# Patient Record
Sex: Female | Born: 2000 | Race: Black or African American | Hispanic: No | Marital: Single | Smoking: Never smoker
Health system: Southern US, Community
[De-identification: ages and names within clinical notes are randomized; demographics above are authoritative.]

## PROBLEM LIST (undated history)

## (undated) DIAGNOSIS — F909 Attention-deficit hyperactivity disorder, unspecified type: Secondary | ICD-10-CM

## (undated) DIAGNOSIS — R569 Unspecified convulsions: Secondary | ICD-10-CM

---
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
## 2023-05-26 ENCOUNTER — Other Ambulatory Visit: Payer: Medicaid Other

## 2023-05-28 ENCOUNTER — Other Ambulatory Visit: Payer: Medicaid Other

## 2023-05-29 ENCOUNTER — Ambulatory Visit (LOCAL_COMMUNITY_HEALTH_CENTER): Payer: Self-pay

## 2023-05-29 ENCOUNTER — Other Ambulatory Visit: Payer: Self-pay

## 2023-05-29 DIAGNOSIS — Z111 Encounter for screening for respiratory tuberculosis: Secondary | ICD-10-CM

## 2023-06-01 ENCOUNTER — Ambulatory Visit: Payer: Medicaid Other

## 2023-06-01 DIAGNOSIS — Z111 Encounter for screening for respiratory tuberculosis: Secondary | ICD-10-CM

## 2023-06-01 LAB — TB SKIN TEST
Induration: 0 mm
TB Skin Test: NEGATIVE

## 2023-06-15 ENCOUNTER — Encounter: Payer: Self-pay | Admitting: Emergency Medicine

## 2023-06-15 ENCOUNTER — Emergency Department
Admission: EM | Admit: 2023-06-15 | Discharge: 2023-06-15 | Disposition: A | Discharge status: Home / Self Care | Payer: Medicaid Other | Attending: Emergency Medicine | Admitting: Emergency Medicine

## 2023-06-15 ENCOUNTER — Emergency Department: Payer: Medicaid Other

## 2023-06-15 ENCOUNTER — Other Ambulatory Visit: Payer: Self-pay

## 2023-06-15 DIAGNOSIS — R569 Unspecified convulsions: Secondary | ICD-10-CM | POA: Insufficient documentation

## 2023-06-15 DIAGNOSIS — R197 Diarrhea, unspecified: Secondary | ICD-10-CM | POA: Diagnosis not present

## 2023-06-15 LAB — LIPASE, BLOOD: Lipase: 24 U/L (ref 11–51)

## 2023-06-15 LAB — BASIC METABOLIC PANEL
Anion gap: 10 (ref 5–15)
BUN: 7 mg/dL (ref 6–20)
CO2: 22 mmol/L (ref 22–32)
Calcium: 9 mg/dL (ref 8.9–10.3)
Chloride: 104 mmol/L (ref 98–111)
Creatinine, Ser: 0.79 mg/dL (ref 0.44–1.00)
GFR, Estimated: >60 mL/min (ref >60)
Glucose, Bld: 104 mg/dL — ABNORMAL HIGH (ref 70–99)
Potassium: 3.8 mmol/L (ref 3.5–5.1)
Sodium: 136 mmol/L (ref 135–145)

## 2023-06-15 LAB — URINALYSIS, ROUTINE W REFLEX MICROSCOPIC
Bilirubin Urine: NEGATIVE
Glucose, UA: NEGATIVE mg/dL
Ketones, ur: NEGATIVE mg/dL
Leukocytes,Ua: NEGATIVE
Nitrite: NEGATIVE
Protein, ur: 30 mg/dL — AB
Specific Gravity, Urine: 1.028 (ref 1.005–1.030)
pH: 5 (ref 5.0–8.0)

## 2023-06-15 LAB — HEPATIC FUNCTION PANEL
ALT: 21 U/L (ref 0–44)
AST: 23 U/L (ref 15–41)
Albumin: 4.1 g/dL (ref 3.5–5.0)
Alkaline Phosphatase: 57 U/L (ref 38–126)
Bilirubin, Direct: 0.1 mg/dL (ref 0.0–0.2)
Indirect Bilirubin: 0.7 mg/dL (ref 0.3–0.9)
Total Bilirubin: 0.8 mg/dL (ref 0.0–1.2)
Total Protein: 7.4 g/dL (ref 6.5–8.1)

## 2023-06-15 LAB — MAGNESIUM: Magnesium: 2.2 mg/dL (ref 1.7–2.4)

## 2023-06-15 LAB — CBG MONITORING, ED: Glucose-Capillary: 110 mg/dL — ABNORMAL HIGH (ref 70–99)

## 2023-06-15 LAB — POC URINE PREG, ED: Preg Test, Ur: NEGATIVE

## 2023-06-15 LAB — CBC
HCT: 42.2 % (ref 36.0–46.0)
Hemoglobin: 14.4 g/dL (ref 12.0–15.0)
MCH: 31.2 pg (ref 26.0–34.0)
MCHC: 34.1 g/dL (ref 30.0–36.0)
MCV: 91.5 fL (ref 80.0–100.0)
Platelets: 271 10*3/uL (ref 150–400)
RBC: 4.61 MIL/uL (ref 3.87–5.11)
RDW: 13.2 % (ref 11.5–15.5)
WBC: 7 10*3/uL (ref 4.0–10.5)
nRBC: 0 % (ref 0.0–0.2)

## 2023-06-15 LAB — TSH: TSH: 0.357 u[IU]/mL (ref 0.350–4.500)

## 2023-06-15 LAB — ETHANOL: Alcohol, Ethyl (B): <10 mg/dL (ref <10)

## 2023-06-15 MED ORDER — SODIUM CHLORIDE 0.9 % IV BOLUS
1000.0000 mL | Freq: Once | INTRAVENOUS | Status: AC
  Administered 2023-06-15: 1000 mL via INTRAVENOUS

## 2023-06-15 MED ORDER — ONDANSETRON 4 MG PO TBDP: 4.0000 mg | ORAL_TABLET | Freq: Three times a day (TID) | ORAL | 0 refills | Status: AC | PRN

## 2023-06-15 MED ORDER — ONDANSETRON HCL 4 MG/2ML IJ SOLN
4.0000 mg | Freq: Once | INTRAMUSCULAR | Status: AC
  Administered 2023-06-15: 4 mg via INTRAVENOUS
  Filled 2023-06-15: qty 2

## 2023-06-15 NOTE — ED Notes (Signed)
 Pt to CT

## 2023-06-15 NOTE — ED Notes (Signed)
 Pt was able to tolerate fluids and graham crackers 20 minutes after consumption. Discharge paper work gone over by this Charity fundraiser. Pt family and pt verbalized understanding.

## 2023-06-15 NOTE — Discharge Instructions (Signed)
 Your lab tests and CT scan of the brain were okay today. Avoid driving, swimming, baths, or other potentially dangerous activity until cleared by Neurology.
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
## 2023-06-15 NOTE — ED Provider Notes (Signed)
 [facility] Provider Note    Event Date/Time   First MD Initiated Contact with Patient 06/15/23 [phone]     (approximate)   History   Chief Complaint: Seizures   HPI  Emma Rose is a 23 y.o. female with no significant history who reports given her usual state of health until last night when she developed vomiting and watery diarrhea after smoking marijuana.  Denies headache.  Had several episodes of diarrhea throughout the night, and then this morning at 5:00 AM was observed to have a seizure with full body shaking activity.  She bit her tongue.  She was confused for a period of time afterward.  Denies any prior seizures, denies headaches or vision changes fever or neck pain.          Physical Exam   Triage Vital Signs: ED Triage Vitals  Encounter Vitals Group     BP 06/15/23 0614 119/72     Systolic BP Percentile --      Diastolic BP Percentile --      Pulse Rate 06/15/23 0614 100     Resp 06/15/23 0614 18     Temp 06/15/23 0614 98.4 F (36.9 C)     Temp Source 06/15/23 0614 Oral     SpO2 06/15/23 0609 97 %     Weight 06/15/23 0610 135 lb (61.2 kg)     Height 06/15/23 0610 5' (1.524 m)     Head Circumference --      Peak Flow --      Pain Score 06/15/23 0609 6     Pain Loc --      Pain Education --      Exclude from Growth Chart --     Most recent vital signs: Vitals:   06/15/23 0614 06/15/23 0831  BP: 119/72 105/69  Pulse: 100 83  Resp: 18 18  Temp: 98.4 F (36.9 C) 98.3 F (36.8 C)  SpO2: 96% 100%    General: Awake, no distress.  CV:  Good peripheral perfusion.  Regular rate rhythm Resp:  Normal effort.  Clear to auscultation bilaterally Abd:  No distention.  Soft nontender Other:  Abrasion to bilateral tongue edges.  No dental injury.  Somewhat dry oral mucosa   ED Results / Procedures / Treatments   Labs (all labs ordered are listed, but only abnormal results are displayed) Labs Reviewed  BASIC METABOLIC  PANEL - Abnormal; Notable for the following components:      Result Value   Glucose, Bld 104 (*)    All other components within normal limits  URINALYSIS, ROUTINE W REFLEX MICROSCOPIC - Abnormal; Notable for the following components:   Color, Urine YELLOW (*)    APPearance CLOUDY (*)    Hgb urine dipstick LARGE (*)    Protein, ur 30 (*)    Bacteria, UA FEW (*)    All other components within normal limits  CBG MONITORING, ED - Abnormal; Notable for the following components:   Glucose-Capillary 110 (*)    All other components within normal limits  CBC  MAGNESIUM  TSH  HEPATIC FUNCTION PANEL  LIPASE, BLOOD  ETHANOL  POC URINE PREG, ED     EKG    RADIOLOGY Ct head interpreted by me, appears normal. Radiology report reviewed   PROCEDURES:  Procedures   MEDICATIONS ORDERED IN ED: Medications  sodium chloride  0.9 % bolus 1,000 mL (1,000 mLs Intravenous New Bag/Given 06/15/23 0857)  ondansetron  (ZOFRAN ) injection 4 mg (4 mg  Intravenous Given 06/15/23 0857)     IMPRESSION / MDM / ASSESSMENT AND PLAN / ED COURSE  I reviewed the triage vital signs and the nursing notes.  DDx: Brain mass, dehydration, electrolyte derangement, cannabinoid hyperemesis syndrome, intracranial hemorrhage, viral illness, dehydration  Patient's presentation is most consistent with acute presentation with potential threat to life or bodily function.  Patient presents with vomiting and diarrhea that started last night followed by a first-time seizure.  No recent head trauma, no fever.  Vital signs are normal, mental status is currently normal.  Initial labs are normal and exam is currently reassuring.  Will add on LFTs TSH lipase ethanol magnesium while giving IV fluids and obtaining CT head.   ----------------------------------------- 11:08 AM on 06/15/2023 ----------------------------------------- Workup all reassuring.  Patient remains asymptomatic in the ED, tolerating oral intake and stable for  discharge.  Counseled on seizure precautions and necessity of neurology follow-up.      FINAL CLINICAL IMPRESSION(S) / ED DIAGNOSES   Final diagnoses:  Seizure (HCC)     Rx / DC Orders   ED Discharge Orders          Ordered    ondansetron  (ZOFRAN -ODT) 4 MG disintegrating tablet  Every 8 hours PRN        06/15/23 1106             Note:  This document was prepared using Dragon voice recognition software and may include unintentional dictation errors.   Jacquie Maudlin, MD 06/15/23 [phone]

## 2023-06-15 NOTE — ED Notes (Signed)
 Cup of ice given by this RN to family member per request

## 2023-07-28 NOTE — Procedures (Signed)
 ROUTINE EEG REPORT  EEG Date: 07/28/23 Start Time: 1355 End Time: 1446 Duration: 51 minutes  HISTORY  Per chart, Emma Rose (MRN: [id number]) is 23 y.o. female with history of febrile convulsions who was recently seen in epilepsy clinic for unprovoked seizure.  EEG to evaluate for epileptiform activity  Pertinent Medications: lacosamide  PROCEDURE  Routine EEG was performed while awake utilizing 21 active electrodes placed according to the international 10-20 system.  The study was recorded digitally with a bandpass of 1-70Hz  and a sampling rate of 200Hz  and was reviewed with the possibility of multiple reformatting.  TECHNICAL DESCRIPTION: Awake The background rhythm was symmetric, normal voltage (20-100uV), and continuous. In maximal wakefulness there was activity predominately in alpha and beta delta range with good anterior to posterior organization.  State changes and reactivity were present.  A 10-11 Hz posterior dominant rhythm was present, which attenuated on eye opening.    Drowsy Drowsiness was characterized by attenuation of the background and bilateral theta slowing. Stage II sleep was not seen on this study.  Interictal No epileptiform discharges or persistent focal asymmetries were seen.  Ictal/ Clinical Events No seizures or push button events were seen.  HV/Photic Stimulation - Hyperventilation shows a presence of buildup but did not trigger abnormal discharges. - Photic stimulation induces a moderate driving response and did not trigger abnormal discharges.  EKG EKG recording shows a regular heart rhythm.   IMPRESSION This routine EEG is within normal limits.   CLINICAL CORRELATION This routine EEG, performed during awake and drowsy state, is normal.  There were no seizures, epileptiform activity, or push button events.  Of note, a normal routine EEG does not exclude a diagnosis of epilepsy.  If clinically warranted, a longer study that  captures sleep could be considered.  Clinical correlation is advised.   Interpreting Physician Penne Samples, MD Attending Epileptologist 07/28/2023

## 2024-03-12 ENCOUNTER — Emergency Department
Admission: EM | Admit: 2024-03-12 | Discharge: 2024-03-12 | Disposition: A | Discharge status: Home / Self Care | Attending: Emergency Medicine | Admitting: Emergency Medicine

## 2024-03-12 ENCOUNTER — Other Ambulatory Visit: Payer: Self-pay

## 2024-03-12 ENCOUNTER — Encounter: Payer: Self-pay | Admitting: *Deleted

## 2024-03-12 DIAGNOSIS — R569 Unspecified convulsions: Secondary | ICD-10-CM | POA: Insufficient documentation

## 2024-03-12 HISTORY — DX: Unspecified convulsions: R56.9

## 2024-03-12 LAB — BASIC METABOLIC PANEL WITH GFR
Anion gap: 13 (ref 5–15)
BUN: 9 mg/dL (ref 6–20)
CO2: 18 mmol/L — ABNORMAL LOW (ref 22–32)
Calcium: 8.9 mg/dL (ref 8.9–10.3)
Chloride: 106 mmol/L (ref 98–111)
Creatinine, Ser: 0.95 mg/dL (ref 0.44–1.00)
GFR, Estimated: >60 mL/min (ref >60)
Glucose, Bld: 106 mg/dL — ABNORMAL HIGH (ref 70–99)
Potassium: 4.6 mmol/L (ref 3.5–5.1)
Sodium: 137 mmol/L (ref 135–145)

## 2024-03-12 LAB — CBC
HCT: 40.9 % (ref 36.0–46.0)
Hemoglobin: 14 g/dL (ref 12.0–15.0)
MCH: 32 pg (ref 26.0–34.0)
MCHC: 34.2 g/dL (ref 30.0–36.0)
MCV: 93.6 fL (ref 80.0–100.0)
Platelets: 198 K/uL (ref 150–400)
RBC: 4.37 MIL/uL (ref 3.87–5.11)
RDW: 13.8 % (ref 11.5–15.5)
WBC: 7.3 K/uL (ref 4.0–10.5)
nRBC: 0 % (ref 0.0–0.2)

## 2024-03-12 MED ORDER — ACETAMINOPHEN 500 MG PO TABS
1000.0000 mg | ORAL_TABLET | Freq: Once | ORAL | Status: AC
  Administered 2024-03-12: 1000 mg via ORAL
  Filled 2024-03-12: qty 2

## 2024-03-12 MED ORDER — LACOSAMIDE 50 MG PO TABS
50.0000 mg | ORAL_TABLET | Freq: Once | ORAL | Status: AC
  Administered 2024-03-12: 50 mg via ORAL
  Filled 2024-03-12: qty 1

## 2024-03-12 MED ORDER — LACOSAMIDE 50 MG PO TABS: 50.0000 mg | ORAL_TABLET | Freq: Two times a day (BID) | ORAL | 1 refills | Status: AC

## 2024-03-12 NOTE — ED Triage Notes (Signed)
 Per EMT's report, patient had a witnessed 4-minute, full-body seizure at her girlfriend's house while sitting on the couch. Patient has a history of one prior seizure and followed-up with Neurology at [facility]. EMT reports patient was A&Ox1 upon their arrival, which improved to A&Ox3 and with mild confusion. Patient is still mildly confused on dates. Patient bit the left side of tongue and lower lip.  98.3 F 139/89 86 pulse 100% room air  CBG 103  Sinus rhythm on strip

## 2024-03-12 NOTE — ED Provider Notes (Signed)
 [facility] Provider Note    Event Date/Time   First MD Initiated Contact with Patient 03/12/24 0935     (approximate)   History   Seizures   HPI  Emma Rose is a 23 y.o. female who presents after generalized tonic-clonic seizure.  Review of records demonstrates the patient had first seizure in February 2025, she saw [facility] neurology on March 5 at which time she was started on lacosamide 50 mg twice daily and subsequently had EEG.  She reports the lacosamide was discontinued shortly after the reassuring EEG however I do not see a record of this.  Notably the patient does smoke marijuana every day     Physical Exam   Triage Vital Signs: ED Triage Vitals  Encounter Vitals Group     BP 03/12/24 0945 120/78     Girls Systolic BP Percentile --      Girls Diastolic BP Percentile --      Boys Systolic BP Percentile --      Boys Diastolic BP Percentile --      Pulse Rate 03/12/24 0945 83     Resp 03/12/24 0945 18     Temp 03/12/24 0945 97.8 F (36.6 C)     Temp Source 03/12/24 0945 Oral     SpO2 03/12/24 0945 100 %     Weight 03/12/24 0947 67.9 kg (149 lb 11.2 oz)     Height 03/12/24 0947 1.372 m (4' 6)     Head Circumference --      Peak Flow --      Pain Score 03/12/24 0946 10     Pain Loc --      Pain Education --      Exclude from Growth Chart --     Most recent vital signs: Vitals:   03/12/24 0945  BP: 120/78  Pulse: 83  Resp: 18  Temp: 97.8 F (36.6 C)  SpO2: 100%     General: Awake, no distress.  CV:  Good peripheral perfusion.  Resp:  Normal effort.  Abd:  No distention.  No loss of bowel or bladder continence, soft, nontender Other:  Left-sided anterior tongue abrasion, shallow abrasion to the lower left lip  Normal neurologic exam  ED Results / Procedures / Treatments   Labs (all labs ordered are listed, but only abnormal results are displayed) Labs Reviewed  BASIC METABOLIC PANEL WITH GFR - Abnormal; Notable for  the following components:      Result Value   CO2 18 (*)    Glucose, Bld 106 (*)    All other components within normal limits  CBC     EKG     RADIOLOGY     PROCEDURES:  Critical Care performed:   Procedures   MEDICATIONS ORDERED IN ED: Medications  lacosamide (VIMPAT) tablet 50 mg (50 mg Oral Given 03/12/24 1014)     IMPRESSION / MDM / ASSESSMENT AND PLAN / ED COURSE  I reviewed the triage vital signs and the nursing notes. Patient's presentation is most consistent with severe exacerbation of chronic illness.  Patient presents after seizure.  This is her second seizure, she is not on antiepileptics at this time.  Reportedly her lacosamide 50 mg was discontinued, she reports she tolerated it well however  Well-appearing and in no acute distress, minor abrasions which do not require repair.  Will check BMP CBC, IV placed in case of another seizure.  Will give p.o. lacosamide and restart her on 50 mg  twice daily with the intention of having her follow up with her neurologist.  ----------------------------------------- 11:56 AM on 03/12/2024 ----------------------------------------- Patient feeling well, ready for discharge, she will follow-up with her neurologist      FINAL CLINICAL IMPRESSION(S) / ED DIAGNOSES   Final diagnoses:  Seizure (HCC)     Rx / DC Orders   ED Discharge Orders          Ordered    lacosamide (VIMPAT) 50 MG TABS tablet  2 times daily        03/12/24 1152             Note:  This document was prepared using Dragon voice recognition software and may include unintentional dictation errors.   Arlander Charleston, MD 03/12/24 [phone]

## 2024-03-12 NOTE — Discharge Instructions (Signed)
 As we discussed, no driving until cleared by neurology

## 2024-03-12 NOTE — ED Notes (Signed)
Seizure pads are in place.
